# Patient Record
Sex: Male | Born: 1970 | Race: Black or African American | Hispanic: No | Marital: Single | Smoking: Never smoker
Health system: Southern US, Community
[De-identification: ages and names within clinical notes are randomized; demographics above are authoritative.]

## PROBLEM LIST (undated history)

## (undated) DIAGNOSIS — I10 Essential (primary) hypertension: Secondary | ICD-10-CM

## (undated) DIAGNOSIS — I2510 Atherosclerotic heart disease of native coronary artery without angina pectoris: Secondary | ICD-10-CM

---

## 2002-06-28 ENCOUNTER — Inpatient Hospital Stay (HOSPITAL_COMMUNITY): Admission: EM | Admit: 2002-06-28 | Discharge: 2002-06-30 | Payer: Self-pay | Admitting: Emergency Medicine

## 2002-08-02 ENCOUNTER — Encounter: Admission: RE | Admit: 2002-08-02 | Discharge: 2002-08-02 | Payer: Self-pay | Admitting: Internal Medicine

## 2002-09-13 ENCOUNTER — Encounter: Admission: RE | Admit: 2002-09-13 | Discharge: 2002-09-13 | Payer: Self-pay | Admitting: Internal Medicine

## 2002-10-01 ENCOUNTER — Encounter: Admission: RE | Admit: 2002-10-01 | Discharge: 2002-10-01 | Payer: Self-pay | Admitting: Internal Medicine

## 2002-10-18 ENCOUNTER — Encounter: Admission: RE | Admit: 2002-10-18 | Discharge: 2002-10-18 | Payer: Self-pay | Admitting: Internal Medicine

## 2002-10-25 ENCOUNTER — Encounter: Admission: RE | Admit: 2002-10-25 | Discharge: 2002-10-25 | Payer: Self-pay | Admitting: Internal Medicine

## 2002-11-27 ENCOUNTER — Encounter: Admission: RE | Admit: 2002-11-27 | Discharge: 2002-11-27 | Payer: Self-pay | Admitting: Internal Medicine

## 2002-12-11 ENCOUNTER — Encounter: Admission: RE | Admit: 2002-12-11 | Discharge: 2002-12-11 | Payer: Self-pay | Admitting: Internal Medicine

## 2013-10-13 ENCOUNTER — Emergency Department (HOSPITAL_COMMUNITY): Payer: Managed Care, Other (non HMO)

## 2013-10-13 ENCOUNTER — Encounter (HOSPITAL_COMMUNITY): Payer: Self-pay | Admitting: Emergency Medicine

## 2013-10-13 ENCOUNTER — Emergency Department (HOSPITAL_COMMUNITY)
Admission: EM | Admit: 2013-10-13 | Discharge: 2013-10-13 | Disposition: A | Discharge status: Home / Self Care | Payer: Managed Care, Other (non HMO) | Attending: Emergency Medicine | Admitting: Emergency Medicine

## 2013-10-13 DIAGNOSIS — I10 Essential (primary) hypertension: Secondary | ICD-10-CM | POA: Insufficient documentation

## 2013-10-13 DIAGNOSIS — E785 Hyperlipidemia, unspecified: Secondary | ICD-10-CM | POA: Insufficient documentation

## 2013-10-13 DIAGNOSIS — R071 Chest pain on breathing: Secondary | ICD-10-CM | POA: Insufficient documentation

## 2013-10-13 DIAGNOSIS — R0789 Other chest pain: Secondary | ICD-10-CM

## 2013-10-13 DIAGNOSIS — I2510 Atherosclerotic heart disease of native coronary artery without angina pectoris: Secondary | ICD-10-CM | POA: Insufficient documentation

## 2013-10-13 DIAGNOSIS — Z7982 Long term (current) use of aspirin: Secondary | ICD-10-CM | POA: Insufficient documentation

## 2013-10-13 DIAGNOSIS — Z79899 Other long term (current) drug therapy: Secondary | ICD-10-CM | POA: Insufficient documentation

## 2013-10-13 HISTORY — DX: Atherosclerotic heart disease of native coronary artery without angina pectoris: I25.10

## 2013-10-13 HISTORY — DX: Essential (primary) hypertension: I10

## 2013-10-13 LAB — POCT I-STAT TROPONIN I: Troponin i, poc: 0.01 ng/mL (ref 0.00–0.08)

## 2013-10-13 LAB — BASIC METABOLIC PANEL
BUN: 14 mg/dL (ref 6–23)
CHLORIDE: 98 meq/L (ref 96–112)
CO2: 29 mEq/L (ref 19–32)
CREATININE: 1.09 mg/dL (ref 0.50–1.35)
Calcium: 9.5 mg/dL (ref 8.4–10.5)
GFR calc Af Amer: >90 mL/min (ref >90)
GFR calc non Af Amer: 83 mL/min — ABNORMAL LOW (ref >90)
Glucose, Bld: 122 mg/dL — ABNORMAL HIGH (ref 70–99)
Potassium: 3.6 mEq/L — ABNORMAL LOW (ref 3.7–5.3)
SODIUM: 140 meq/L (ref 137–147)

## 2013-10-13 LAB — CBC
HEMATOCRIT: 42.1 % (ref 39.0–52.0)
Hemoglobin: 15 g/dL (ref 13.0–17.0)
MCH: 30.4 pg (ref 26.0–34.0)
MCHC: 35.6 g/dL (ref 30.0–36.0)
MCV: 85.2 fL (ref 78.0–100.0)
Platelets: 222 10*3/uL (ref 150–400)
RBC: 4.94 MIL/uL (ref 4.22–5.81)
RDW: 13.1 % (ref 11.5–15.5)
WBC: 10.6 10*3/uL — AB (ref 4.0–10.5)

## 2013-10-13 LAB — D-DIMER, QUANTITATIVE: D-Dimer, Quant: <0.27 ug/mL-FEU (ref 0.00–0.48)

## 2013-10-13 MED ORDER — ASPIRIN 81 MG PO CHEW
162.0000 mg | CHEWABLE_TABLET | Freq: Once | ORAL | Status: AC
  Administered 2013-10-13: 162 mg via ORAL
  Filled 2013-10-13: qty 2

## 2013-10-13 MED ORDER — KETOROLAC TROMETHAMINE 30 MG/ML IJ SOLN
30.0000 mg | Freq: Once | INTRAMUSCULAR | Status: AC
  Administered 2013-10-13: 30 mg via INTRAVENOUS
  Filled 2013-10-13: qty 1

## 2013-10-13 NOTE — ED Provider Notes (Signed)
CSN: [id number]     Arrival date & time 10/13/13  0418 History   First MD Initiated Contact with Patient 10/13/13 [phone]     Chief Complaint  Patient presents with  . Chest Pain   (Consider location/radiation/quality/duration/timing/severity/associated sxs/prior Treatment) Patient is a 43 y.o. male presenting with chest pain. The history is provided by the patient.  Chest Pain He woke up at 2:30 AM with sharp left-sided chest pain. Pain was mild to moderate and he rated at 4/10. It was worse if he took a deep breath and worse with movement. There is no associated dyspnea, nausea, diaphoresis. He took aspirin 162 mg with no relief. He does have a number having had pain like this before. He does work in [employer] and may have pulled muscle but does not remember a specific action that caused a problem the denies any recent travel and recent surgery. He is a nonsmoker nondrinker. There is a history of hypertension hyperlipidemia but no history of diabetes. He has never had any heart problems. There is no family history of coronary artery disease.  Past Medical History  Diagnosis Date  . Hypertension   . Coronary artery disease    History reviewed. No pertinent past surgical history. No family history on file. History  Substance Use Topics  . Smoking status: Never Smoker   . Smokeless tobacco: Not on file  . Alcohol Use: Yes     Comment: socially    Review of Systems  Cardiovascular: Positive for chest pain.  All other systems reviewed and are negative.    Allergies  Review of patient's allergies indicates not on file.  Home Medications  No current outpatient prescriptions on file. BP 137/85  Pulse 82  Temp(Src) 98.2 F (36.8 C) (Oral)  Resp 18  SpO2 100% Physical Exam  Nursing note and vitals reviewed.  43 year old male, resting comfortably and in no acute distress. Vital signs are normal. Oxygen saturation is 100%, which is normal. Head is normocephalic and atraumatic.  PERRLA, EOMI. Oropharynx is clear. Neck is nontender and supple without adenopathy or JVD. Back is nontender and there is no CVA tenderness. Lungs are clear without rales, wheezes, or rhonchi. Chest is mildly tender in the left anterior chest wall which does reproduce his pain. Heart has regular rate and rhythm without murmur. Abdomen is soft, flat, nontender without masses or hepatosplenomegaly and peristalsis is normoactive. Extremities have no cyanosis or edema, full range of motion is present. Skin is warm and dry without rash. Neurologic: Mental status is normal, cranial nerves are intact, there are no motor or sensory deficits.  ED Course  Procedures (including critical care time) Labs Review Results for orders placed during the hospital encounter of 24/40/10  BASIC METABOLIC PANEL      Result Value Range   Sodium 140  137 - 147 mEq/L   Potassium 3.6 (*) 3.7 - 5.3 mEq/L   Chloride 98  96 - 112 mEq/L   CO2 29  19 - 32 mEq/L   Glucose, Bld 122 (*) 70 - 99 mg/dL   BUN 14  6 - 23 mg/dL   Creatinine, Ser 1.09  0.50 - 1.35 mg/dL   Calcium 9.5  8.4 - 10.5 mg/dL   GFR calc non Af Amer 83 (*) >90 mL/min   GFR calc Af Amer >90  >90 mL/min  CBC      Result Value Range   WBC 10.6 (*) 4.0 - 10.5 K/uL   RBC 4.94  4.22 -  5.81 MIL/uL   Hemoglobin 15.0  13.0 - 17.0 g/dL   HCT 42.1  39.0 - 52.0 %   MCV 85.2  78.0 - 100.0 fL   MCH 30.4  26.0 - 34.0 pg   MCHC 35.6  30.0 - 36.0 g/dL   RDW 13.1  11.5 - 15.5 %   Platelets 222  150 - 400 K/uL  D-DIMER, QUANTITATIVE      Result Value Range   D-Dimer, Quant <0.27  0.00 - 0.48 ug/mL-FEU  POCT I-STAT TROPONIN I      Result Value Range   Troponin i, poc 0.01  0.00 - 0.08 ng/mL   Comment 3            Imaging Review Dg Chest 2 View (if Patient Has Fever And/or Copd)  10/13/2013   CLINICAL DATA:  Chest pain  EXAM: CHEST  2 VIEW  COMPARISON:  None available  FINDINGS: The cardiac and mediastinal silhouettes are within normal limits.  The lungs  are normally inflated. No airspace consolidation, pleural effusion, or pulmonary edema is identified. There is no pneumothorax.  No acute osseous abnormality identified.  IMPRESSION: No active cardiopulmonary disease.   Electronically Signed   By: Jeannine Boga M.D.   On: 10/13/2013 06:11    EKG Interpretation    Date/Time:  Saturday October 13 2013 04:19:32 EST Ventricular Rate:  88 PR Interval:  140 QRS Duration: 92 QT Interval:  352 QTC Calculation: 425 R Axis:   24 Text Interpretation:  Normal sinus rhythm T wave abnormality, consider lateral ischemia Abnormal ECG No old tracing to compare Confirmed by [facility]  MD, Coltan Spinello ([id number]) on 10/13/2013 4:33:47 AM            MDM   1. Musculoskeletal chest pain    Chest pain which seems most likely to be musculoskeletal in. He does have cardiac risk factors of hypertension and hyperlipidemia. He has no risk factors for pulmonary embolism. However, ECG does show T-wave inversions in the lateral leads with no prior ECG available. This could be early changes of left ventricular hypertrophy. Screening labs are obtained and he will be given a trial of ketorolac.  Workup is unremarkable. He had relief of pain with ketorolac. He is discharged with instructions to use over-the-counter naproxen as needed for pain control. He is to followup with his PCP.  Delora Fuel, MD 61/90/12 2241

## 2013-10-13 NOTE — ED Notes (Signed)
Woke up with left sided cp. Feels like cramping. Non reproducible.  Took x 2 baby asa. No other associated symptoms.

## 2013-10-13 NOTE — Discharge Instructions (Signed)
Take two naproxen tablets at a time, twice a day.  Chest Wall Pain Chest wall pain is pain in or around the bones and muscles of your chest. It may take up to 6 weeks to get better. It may take longer if you must stay physically active in your work and activities.  CAUSES  Chest wall pain may happen on its own. However, it may be caused by:  A viral illness like the flu.  Injury.  Coughing.  Exercise.  Arthritis.  Fibromyalgia.  Shingles. HOME CARE INSTRUCTIONS   Avoid overtiring physical activity. Try not to strain or perform activities that cause pain. This includes any activities using your chest or your abdominal and side muscles, especially if heavy weights are used.  Put ice on the sore area.  Put ice in a plastic bag.  Place a towel between your skin and the bag.  Leave the ice on for 15-20 minutes per hour while awake for the first 2 days.  Only take over-the-counter or prescription medicines for pain, discomfort, or fever as directed by your caregiver. SEEK IMMEDIATE MEDICAL CARE IF:   Your pain increases, or you are very uncomfortable.  You have a fever.  Your chest pain becomes worse.  You have new, unexplained symptoms.  You have nausea or vomiting.  You feel sweaty or lightheaded.  You have a cough with phlegm (sputum), or you cough up blood. MAKE SURE YOU:   Understand these instructions.  Will watch your condition.  Will get help right away if you are not doing well or get worse. Document Released: 09/20/2005 Document Revised: 12/13/2011 Document Reviewed: 05/17/2011 [facility] Patient Information 2014 [place], [place].

## 2017-08-25 ENCOUNTER — Emergency Department (HOSPITAL_COMMUNITY)
Admission: EM | Admit: 2017-08-25 | Discharge: 2017-08-25 | Disposition: A | Discharge status: Home / Self Care | Payer: Managed Care, Other (non HMO) | Attending: Emergency Medicine | Admitting: Emergency Medicine

## 2017-08-25 ENCOUNTER — Other Ambulatory Visit: Payer: Self-pay

## 2017-08-25 ENCOUNTER — Encounter (HOSPITAL_COMMUNITY): Payer: Self-pay

## 2017-08-25 DIAGNOSIS — R002 Palpitations: Secondary | ICD-10-CM

## 2017-08-25 DIAGNOSIS — Z79899 Other long term (current) drug therapy: Secondary | ICD-10-CM | POA: Diagnosis not present

## 2017-08-25 DIAGNOSIS — I10 Essential (primary) hypertension: Secondary | ICD-10-CM | POA: Diagnosis not present

## 2017-08-25 DIAGNOSIS — I2510 Atherosclerotic heart disease of native coronary artery without angina pectoris: Secondary | ICD-10-CM | POA: Diagnosis not present

## 2017-08-25 LAB — BASIC METABOLIC PANEL
Anion gap: 8 (ref 5–15)
BUN: 12 mg/dL (ref 6–20)
CHLORIDE: 101 mmol/L (ref 101–111)
CO2: 27 mmol/L (ref 22–32)
Calcium: 9.6 mg/dL (ref 8.9–10.3)
Creatinine, Ser: 1.07 mg/dL (ref 0.61–1.24)
GFR calc non Af Amer: >60 mL/min (ref >60)
Glucose, Bld: 156 mg/dL — ABNORMAL HIGH (ref 65–99)
POTASSIUM: 3.7 mmol/L (ref 3.5–5.1)
SODIUM: 136 mmol/L (ref 135–145)

## 2017-08-25 LAB — CBC
HCT: 42.1 % (ref 39.0–52.0)
Hemoglobin: 14.7 g/dL (ref 13.0–17.0)
MCH: 30.5 pg (ref 26.0–34.0)
MCHC: 34.9 g/dL (ref 30.0–36.0)
MCV: 87.3 fL (ref 78.0–100.0)
Platelets: 230 10*3/uL (ref 150–400)
RBC: 4.82 MIL/uL (ref 4.22–5.81)
RDW: 13.2 % (ref 11.5–15.5)
WBC: 8.8 10*3/uL (ref 4.0–10.5)

## 2017-08-25 NOTE — ED Triage Notes (Signed)
Patient states that when he awoke this am had palpitations. No associated symptoms, NAD. Alert and oriented. Denies CP

## 2017-08-25 NOTE — ED Notes (Signed)
Placed patient on the monitor patient is resting with call bell in reach

## 2017-08-25 NOTE — ED Notes (Signed)
Patient states he was fine before going to bed last pm, this am woke with a fluttering in his chest . Denies chest pain  Or sob. States he feels skipped beats ,

## 2017-08-25 NOTE — ED Provider Notes (Signed)
[facility] EMERGENCY DEPARTMENT Provider Note   CSN: [id number] Arrival date & time: 08/25/17  4098     History   Chief Complaint Chief Complaint  Patient presents with  . Palpitations    HPI Donald Fletcher is a 46 y.o. male.  HPI  Donald Fletcher is a 46yo male with a history of hypertension, hyperlipidemia who presents to the Emergency Department for evaluation of fluttering in the chest.  Patient states that he has had brief intermittent episodes of fluttering in the chest for years.  In the past these episodes have lasted seconds at a time. States that this morning the fluttering woke him up from sleep and persisted for 2 hours, which is atypical.  He denies associated chest pain, shortness of breath, diaphoresis, syncope, lightheadedness or dizziness.  States that the onset and offset of the fluttering sensation is gradual.  Is wondering if it has anything to do with anxiety as he has had more stress at work recently working longer hours and having more responsibility.  States that he was anxious on his way to the hospital and had another episode of fluttering in the chest.  Denies this sensation currently. Also states that he has muscle twitching in the temples when he has episodes of fluttering.  Denies history of exertional chest pain.  Denies smoking history.  Denies recreational drug use.  Denies family history of sudden cardiac death or ischemic heart disease.  Past Medical History:  Diagnosis Date  . Coronary artery disease   . Hypertension     There are no active problems to display for this patient.   History reviewed. No pertinent surgical history.     Home Medications    Prior to Admission medications   Medication Sig Start Date End Date Taking? Authorizing Provider  amLODipine (NORVASC) 5 MG tablet Take 1 tablet by mouth daily. 09/12/13  Yes [provider]  aspirin EC 81 MG tablet Take 81 mg by mouth daily as needed for mild pain.     Yes [provider]  brimonidine (ALPHAGAN) 0.2 % ophthalmic solution Place 1 drop into both eyes 2 (two) times daily. 10/11/13  Yes [provider]  lisinopril-hydrochlorothiazide (PRINZIDE,ZESTORETIC) 20-12.5 MG per tablet Take 1 tablet by mouth daily. 10/05/13  Yes [provider]  simvastatin (ZOCOR) 20 MG tablet Take 1 tablet by mouth daily. 10/01/13  Yes [provider]    Family History No family history on file.  Social History Social History   Tobacco Use  . Smoking status: Never Smoker  . Smokeless tobacco: Never Used  Substance Use Topics  . Alcohol use: Yes    Comment: socially  . Drug use: No     Allergies   Patient has no known allergies.   Review of Systems Review of Systems  Constitutional: Negative for diaphoresis and fever.  Eyes: Negative for visual disturbance.  Respiratory: Negative for cough and shortness of breath.   Cardiovascular: Positive for palpitations. Negative for chest pain and leg swelling.  Gastrointestinal: Negative for abdominal pain, nausea and vomiting.  Genitourinary: Negative for difficulty urinating.  Musculoskeletal: Negative for back pain.  Skin: Negative for rash.  Neurological: Negative for dizziness, weakness, light-headedness, numbness and headaches.  Psychiatric/Behavioral: The patient is nervous/anxious.      Physical Exam Updated Vital Signs BP 131/76   Pulse 62   Temp 98.4 F (36.9 C) (Oral)   Resp (!) 21   Ht 5' 9"  (1.753 m)   Wt 99.8  kg (220 lb)   SpO2 98%   BMI 32.49 kg/m   Physical Exam  Constitutional: He is oriented to person, place, and time. He appears well-developed and well-nourished. No distress.  HENT:  Head: Normocephalic and atraumatic.  Mouth/Throat: Oropharynx is clear and moist. No oropharyngeal exudate.  Eyes: Conjunctivae are normal. Pupils are equal, round, and reactive to light. Right eye exhibits no discharge. Left eye exhibits no discharge.  Neck:  Normal range of motion. Neck supple.  No thyromegaly or thyroid nodules appreciated.   Cardiovascular:  One premature beat heard on auscultation. Patient identifies that he feels this heart beat. Otherwise normal rate, regular rhythm. No murmurs. Intact distal pulses.   Pulmonary/Chest: Effort normal and breath sounds normal. No stridor. No respiratory distress. He has no wheezes. He has no rales. He exhibits no tenderness.  Abdominal: Soft. Bowel sounds are normal. There is no tenderness. There is no guarding.  Musculoskeletal: Normal range of motion.  Lymphadenopathy:    He has no cervical adenopathy.  Neurological: He is alert and oriented to person, place, and time. Coordination normal.  Skin: Skin is warm and dry. Capillary refill takes less than 2 seconds. He is not diaphoretic.  Psychiatric: He has a normal mood and affect. His behavior is normal.  Nursing note and vitals reviewed.    ED Treatments / Results  Labs (all labs ordered are listed, but only abnormal results are displayed) Labs Reviewed  BASIC METABOLIC PANEL - Abnormal; Notable for the following components:      Result Value   Glucose, Bld 156 (*)    All other components within normal limits  CBC    EKG  EKG Interpretation  Date/Time:  Thursday August 25 2017 09:33:16 EST Ventricular Rate:  85 PR Interval:  156 QRS Duration: 84 QT Interval:  380 QTC Calculation: 452 R Axis:   39 Text Interpretation:  Normal sinus rhythm Nonspecific T wave abnormality No significant change since last tracing Confirmed by Lajean Saver [phone]) on 08/25/2017 9:57:06 AM       Radiology No results found.  Procedures Procedures (including critical care time)  Medications Ordered in ED Medications - No data to display   Initial Impression / Assessment and Plan / ED Course  I have reviewed the triage vital signs and the nursing notes.  Pertinent labs & imaging results that were available during my care of the  patient were reviewed by me and considered in my medical decision making (see chart for details).     Patient presents with intermittent fluttering in the chest. No associated CP, SOB, diaphoresis, N/V, syncope. No prior history of cardiac ischemia. Vital signs stable. Do not suspect ACS given presentation. EKG normal sinus with t-wave inversions in inferior leads, this is stable from his previous EKG 10/2013. CBC and CMP unremarkable.   Discussed patient's lab work and EKG at bedside. Suspect his palpitations may be him feeling an occasional PVC given premature beat heard on auscultation with patient recognizing this as abnormal. Counseled him to have follow up with his PCP regarding his ER visit today. His blood pressure was mildly elevated, patient states he has not taking his home bp medication. Counseled him to have blood pressure recheck with PCP. His glucose was also elevated, patient reports eating a biscuit prior to getting blood draw. Counseled him to have this rechecked as well. Discussed return precautions and patient agrees and voices understanding to this plan. Discussed this patient with Dr. Ashok Cordia who agrees with plan to  discharge.    Final Clinical Impressions(s) / ED Diagnoses   Final diagnoses:  Palpitations    ED Discharge Orders    None       Bernarda Caffey 08/25/17 1110    Lajean Saver, MD 08/25/17 1236

## 2017-08-25 NOTE — Discharge Instructions (Signed)
Your EKG and blood work were very reassuring today.  Please schedule an appointment with your primary doctor to discuss your ER visit today and also for recheck of your blood pressure which was elevated today.  Return to the emergency department if you have fluttering in the chest with associated chest pain, shortness of breath, nausea/vomiting or feeling like you are going to pass out.

## 2018-09-18 ENCOUNTER — Encounter (HOSPITAL_COMMUNITY): Payer: Self-pay | Admitting: Emergency Medicine

## 2018-09-18 ENCOUNTER — Ambulatory Visit (HOSPITAL_COMMUNITY)
Admission: EM | Admit: 2018-09-18 | Discharge: 2018-09-18 | Disposition: A | Discharge status: Home / Self Care | Payer: Managed Care, Other (non HMO) | Attending: Physician Assistant | Admitting: Physician Assistant

## 2018-09-18 DIAGNOSIS — R519 Headache, unspecified: Secondary | ICD-10-CM

## 2018-09-18 DIAGNOSIS — R51 Headache: Secondary | ICD-10-CM | POA: Diagnosis not present

## 2018-09-18 NOTE — Discharge Instructions (Signed)
No alarming signs on exam. Naproxen 474m twice a day. You can take tylenol for break through pain. Keep hydrated, your urine should be clear to pale yellow in color. Follow up with PCP for further evaluation if symptoms not improving. If experiencing worsening of symptoms, worsening headache/worse headache of your life, blurry vision, nausea/vomiting, confusion/altered mental status, weakness, passing out, imbalance, go to the emergency department for further evaluation.

## 2018-09-18 NOTE — ED Provider Notes (Signed)
[name]    CSN: [id number] Arrival date & time: 09/18/18  1104     History   Chief Complaint Chief Complaint  Patient presents with  . Headache    HPI Donald Fletcher is a 47 y.o. male.   47 year old male comes in for 2-week history of headache.  States headache is left-sided with sharp pain that resolves immediately, but returns every 15 to 30 minutes.  Pain is about a 3 out of 10, has been taking Tylenol/Motrin with improvement to about 1 out of 10.  He has had some dizziness/lightheadedness with positional changes and/or head movement.  Denies one-sided weakness, syncope.  Denies chest pain, shortness of breath, palpitation.  States had history of anemia, so has been taking some iron pills to see if it helps with symptoms.  Denies URI symptoms such as cough, congestion, sore throat.  Denies fever, chills, night sweats.  Occasional alcohol use, stopped using alcohol 2 weeks ago which helped dizziness slightly.  Never smoker.  Denies illicit drug use.  Patient states has been seen by [facility] urgent [facility], and basic labs were drawn.  At the time, they suggested switching to Aleve to help with symptoms, which did help improve symptoms, but did not resolve symptoms.  Also was noted to have high blood pressure, so has been eating more healthy and exercising and blood pressure is under control.     Past Medical History:  Diagnosis Date  . Coronary artery disease   . Hypertension     There are no active problems to display for this patient.   History reviewed. No pertinent surgical history.     Home Medications    Prior to Admission medications   Medication Sig Start Date End Date Taking? Authorizing Provider  amLODipine (NORVASC) 5 MG tablet Take 1 tablet by mouth daily. 09/12/13   [provider]  aspirin EC 81 MG tablet Take 81 mg by mouth daily as needed for mild pain.     [provider]  brimonidine (ALPHAGAN) 0.2 % ophthalmic solution  Place 1 drop into both eyes 2 (two) times daily. 10/11/13   [provider]  lisinopril-hydrochlorothiazide (PRINZIDE,ZESTORETIC) 20-12.5 MG per tablet Take 1 tablet by mouth daily. 10/05/13   [provider]  simvastatin (ZOCOR) 20 MG tablet Take 1 tablet by mouth daily. 10/01/13   [provider]    Family History No family history on file.  Social History Social History   Tobacco Use  . Smoking status: Never Smoker  . Smokeless tobacco: Never Used  Substance Use Topics  . Alcohol use: Yes    Comment: socially  . Drug use: No     Allergies   Patient has no known allergies.   Review of Systems Review of Systems   Physical Exam Triage Vital Signs ED Triage Vitals  Enc Vitals Group     BP 09/18/18 1155 125/80     Pulse Rate 09/18/18 1155 74     Resp 09/18/18 1155 16     Temp 09/18/18 1155 98.3 F (36.8 C)     Temp src --      SpO2 09/18/18 1155 99 %     Weight --      Height --      Head Circumference --      Peak Flow --      Pain Score 09/18/18 1156 0     Pain Loc --      Pain Edu? --  Excl. in GC? --    Orthostatic VS for the past 24 hrs:  BP- Lying Pulse- Lying BP- Sitting Pulse- Sitting  09/18/18 1316 131/79 72 125/80 75    Updated Vital Signs BP 125/80   Pulse 74   Temp 98.3 F (36.8 C)   Resp 16   SpO2 99%   Physical Exam Constitutional:      General: He is not in acute distress.    Appearance: He is well-developed. He is not ill-appearing, toxic-appearing or diaphoretic.  HENT:     Head: Normocephalic and atraumatic.     Right Ear: Tympanic membrane, ear canal and external ear normal. Tympanic membrane is not erythematous or bulging.     Left Ear: Tympanic membrane, ear canal and external ear normal. Tympanic membrane is not erythematous or bulging.     Nose: Nose normal.     Right Sinus: No maxillary sinus tenderness or frontal sinus tenderness.     Left Sinus: No maxillary sinus tenderness or frontal sinus  tenderness.     Mouth/Throat:     Pharynx: Uvula midline.  Eyes:     Extraocular Movements: Extraocular movements intact.     Conjunctiva/sclera: Conjunctivae normal.     Pupils: Pupils are equal, round, and reactive to light.  Neck:     Musculoskeletal: Normal range of motion and neck supple.  Cardiovascular:     Rate and Rhythm: Normal rate and regular rhythm.     Heart sounds: Normal heart sounds. No murmur. No friction rub. No gallop.   Pulmonary:     Effort: Pulmonary effort is normal. No respiratory distress.     Breath sounds: Normal breath sounds. No stridor. No decreased breath sounds, wheezing, rhonchi or rales.  Lymphadenopathy:     Cervical: No cervical adenopathy.  Skin:    General: Skin is warm and dry.  Neurological:     Mental Status: He is alert and oriented to person, place, and time.     GCS: GCS eye subscore is 4. GCS verbal subscore is 5. GCS motor subscore is 6.     Cranial Nerves: Cranial nerves are intact.     Sensory: Sensation is intact.     Motor: Motor function is intact. No pronator drift.     Coordination: Coordination is intact. Romberg sign negative. Coordination normal. Finger-Nose-Finger Test and Heel to [facility] Test normal.     Gait: Gait is intact. Gait and tandem walk normal.  Psychiatric:        Behavior: Behavior normal.        Judgment: Judgment normal.     UC Treatments / Results  Labs (all labs ordered are listed, but only abnormal results are displayed) Labs Reviewed - No data to display  EKG None  Radiology No results found.  Procedures Procedures (including critical care time)  Medications Ordered in UC Medications - No data to display  Initial Impression / Assessment and Plan / UC Course  I have reviewed the triage vital signs and the nursing notes.  Pertinent labs & imaging results that were available during my care of the patient were reviewed by me and considered in my medical decision making (see chart for  details).    Negative orthostatics. On reexam, patient states recalled that he hit head with equipment at work about 2 weeks ago. Denies LOC at that time. Neurology exam grossly intact. Will have patient continue symptomatic treatment and follow up with PCP for further evaluation if symptoms not improving. Return precautions  given. Patient expresses understanding and agrees to plan.  Final Clinical Impressions(s) / UC Diagnoses   Final diagnoses:  Acute intractable headache, unspecified headache type    ED Prescriptions    None        Ok Edwards, PA-C 09/18/18 1336

## 2018-09-18 NOTE — ED Triage Notes (Signed)
Pt states hes had a slight headache x2 weeks. Went to his PCp and his BP was elevated, has seen several people and they just stated it was his elevated BP. Pt states his BP has been normal and his head still hurts.

## 2018-09-25 ENCOUNTER — Emergency Department (HOSPITAL_COMMUNITY): Payer: Managed Care, Other (non HMO)

## 2018-09-25 ENCOUNTER — Encounter (HOSPITAL_COMMUNITY): Payer: Self-pay

## 2018-09-25 ENCOUNTER — Emergency Department (HOSPITAL_COMMUNITY)
Admission: EM | Admit: 2018-09-25 | Discharge: 2018-09-25 | Disposition: A | Discharge status: Home / Self Care | Payer: Managed Care, Other (non HMO) | Attending: Emergency Medicine | Admitting: Emergency Medicine

## 2018-09-25 ENCOUNTER — Other Ambulatory Visit: Payer: Self-pay

## 2018-09-25 DIAGNOSIS — W208XXD Other cause of strike by thrown, projected or falling object, subsequent encounter: Secondary | ICD-10-CM | POA: Diagnosis not present

## 2018-09-25 DIAGNOSIS — Z79899 Other long term (current) drug therapy: Secondary | ICD-10-CM | POA: Insufficient documentation

## 2018-09-25 DIAGNOSIS — F0781 Postconcussional syndrome: Secondary | ICD-10-CM | POA: Diagnosis not present

## 2018-09-25 DIAGNOSIS — R51 Headache: Secondary | ICD-10-CM | POA: Insufficient documentation

## 2018-09-25 NOTE — ED Triage Notes (Signed)
Pt states he has had headache X3 weeks. Pt states he was hit in the head with a metal hose at work 3 weeks ago that he initially forgot. He has been seen at [facility]. Pt alert and oriented, denies any dizziness.

## 2018-09-25 NOTE — ED Notes (Signed)
Pt. Stated Headache for 3 weeks. I got hit with a metal piece at work, but Donnald Garre had a headache since then. Its been off and on at a pain scale  1/10. I m going out of town next week, and I was hoping I could have a xray or something.
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
## 2018-11-05 ENCOUNTER — Emergency Department (HOSPITAL_COMMUNITY)
Admission: EM | Admit: 2018-11-05 | Discharge: 2018-11-05 | Disposition: A | Discharge status: Home / Self Care | Payer: Managed Care, Other (non HMO) | Attending: Emergency Medicine | Admitting: Emergency Medicine

## 2018-11-05 ENCOUNTER — Encounter (HOSPITAL_COMMUNITY): Payer: Self-pay | Admitting: Emergency Medicine

## 2018-11-05 DIAGNOSIS — I10 Essential (primary) hypertension: Secondary | ICD-10-CM

## 2018-11-05 DIAGNOSIS — I2510 Atherosclerotic heart disease of native coronary artery without angina pectoris: Secondary | ICD-10-CM | POA: Diagnosis not present

## 2018-11-05 DIAGNOSIS — Z79899 Other long term (current) drug therapy: Secondary | ICD-10-CM | POA: Insufficient documentation

## 2018-11-05 NOTE — ED Provider Notes (Signed)
[facility] Provider Note   CSN: [id number] Arrival date & time: 11/05/18  1837     History   Chief Complaint Chief Complaint  Patient presents with  . Hypertension  . Headache    HPI Donald Fletcher is a 48 y.o. male.  48yo M w/ h/o CAD, HTN who p/w HTN.  Patient has history of hypertension and is on medication for it.  Recently his PCP increased his amlodipine and he is supposed to follow-up soon for recheck.  He notes that he went out of town on vacation and forgot to bring his medications with him.  He missed yesterday's dose of medicines.  Today as he was driving home he noticed some mild pressure on his left forehead.  He became concerned that his blood pressure may be elevated and he checked it and noted hypertension.  He checked it 2 more times and it continued to be high.  He became concerned when it was 371 systolic.  He took his home medications approximately an hour ago.  Right now his head pressure has resolved and he denies any complaints including no vision changes, numbness/weakness, chest pain, breathing problems, nausea, or vomiting.  He reports that he ate poorly while he was on vacation.  The history is provided by the patient.  Hypertension  Associated symptoms include headaches.  Headache    Past Medical History:  Diagnosis Date  . Coronary artery disease   . Hypertension     There are no active problems to display for this patient.   History reviewed. No pertinent surgical history.      Home Medications    Prior to Admission medications   Medication Sig Start Date End Date Taking? Authorizing Provider  amLODipine (NORVASC) 5 MG tablet Take 1 tablet by mouth daily. 09/12/13   [provider]  aspirin EC 81 MG tablet Take 81 mg by mouth daily as needed for mild pain.     [provider]  brimonidine (ALPHAGAN) 0.2 % ophthalmic solution Place 1 drop into both eyes 2 (two) times daily. 10/11/13    [provider]  lisinopril-hydrochlorothiazide (PRINZIDE,ZESTORETIC) 20-12.5 MG per tablet Take 1 tablet by mouth daily. 10/05/13   [provider]  simvastatin (ZOCOR) 20 MG tablet Take 1 tablet by mouth daily. 10/01/13   [provider]    Family History No family history on file.  Social History Social History   Tobacco Use  . Smoking status: Never Smoker  . Smokeless tobacco: Never Used  Substance Use Topics  . Alcohol use: Yes    Comment: socially  . Drug use: No     Allergies   Patient has no known allergies.   Review of Systems Review of Systems  Neurological: Positive for headaches.   All other systems reviewed and are negative except that which was mentioned in HPI   Physical Exam Updated Vital Signs BP (!) 151/103   Pulse 95   Temp 98.3 F (36.8 C) (Oral)   Resp 18   Ht 5' 9"  (1.753 m)   Wt 99.8 kg   SpO2 98%   BMI 32.49 kg/m   Physical Exam Vitals signs and nursing note reviewed.  Constitutional:      General: He is not in acute distress.    Appearance: He is well-developed.  HENT:     Head: Normocephalic and atraumatic.  Eyes:     Extraocular Movements: Extraocular movements intact.     Conjunctiva/sclera: Conjunctivae normal.  Pupils: Pupils are equal, round, and reactive to light.  Neck:     Musculoskeletal: Neck supple.  Cardiovascular:     Rate and Rhythm: Normal rate and regular rhythm.     Heart sounds: Normal heart sounds. No murmur.  Pulmonary:     Effort: Pulmonary effort is normal.     Breath sounds: Normal breath sounds.  Abdominal:     General: Bowel sounds are normal. There is no distension.     Palpations: Abdomen is soft.     Tenderness: There is no abdominal tenderness.  Skin:    General: Skin is warm and dry.  Neurological:     Mental Status: He is alert and oriented to person, place, and time.     Cranial Nerves: No cranial nerve deficit or dysarthria.     Sensory: No sensory deficit.       Motor: No weakness.     Coordination: Coordination normal.     Deep Tendon Reflexes: Reflexes normal.     Comments: Fluent speech, no clonus, 5/5 strength throughout  Psychiatric:        Mood and Affect: Mood normal.        Speech: Speech normal.        Behavior: Behavior normal.        Judgment: Judgment normal.      ED Treatments / Results  Labs (all labs ordered are listed, but only abnormal results are displayed) Labs Reviewed - No data to display  EKG None  Radiology No results found.  Procedures Procedures (including critical care time)  Medications Ordered in ED Medications - No data to display   Initial Impression / Assessment and Plan / ED Course  I have reviewed the triage vital signs and the nursing notes.      BP improved since arriving to the ED. No concerning features to suggest hypertensive emergency.  He understands importance of continuing his home medications and returning to his low-salt diet.  He already plans to follow-up with PCP for blood pressure recheck.  Return precautions reviewed and he voiced understanding.  Final Clinical Impressions(s) / ED Diagnoses   Final diagnoses:  Essential hypertension    ED Discharge Orders    None       Tolbert Matheson, Wenda Overland, MD 11/05/18 2007

## 2018-11-05 NOTE — ED Triage Notes (Signed)
Pt states he went to the beach without his meds X 1 day. Came home today and began having headache to forehead. No blurred vision or neuro deficits. Pt took his BP and noted it was in the 200s. Pt then took a dose of his missed BP meds, that was around 1820.
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
## 2021-01-29 LAB — EXTERNAL GENERIC LAB PROCEDURE

## 2021-03-17 LAB — EXTERNAL GENERIC LAB PROCEDURE: COLOGUARD: NEGATIVE

## 2023-01-04 ENCOUNTER — Other Ambulatory Visit: Payer: Self-pay

## 2023-01-04 ENCOUNTER — Ambulatory Visit
Admission: EM | Admit: 2023-01-04 | Discharge: 2023-01-04 | Disposition: A | Discharge status: Home / Self Care | Payer: Managed Care, Other (non HMO) | Attending: Physician Assistant | Admitting: Physician Assistant

## 2023-01-04 ENCOUNTER — Encounter: Payer: Self-pay | Admitting: Emergency Medicine

## 2023-01-04 DIAGNOSIS — L234 Allergic contact dermatitis due to dyes: Secondary | ICD-10-CM | POA: Diagnosis not present

## 2023-01-04 MED ORDER — PREDNISONE 10 MG PO TABS: 10.0000 mg | ORAL_TABLET | Freq: Three times a day (TID) | ORAL | 0 refills | Status: AC

## 2023-01-04 MED ORDER — TRIAMCINOLONE ACETONIDE 40 MG/ML IJ SUSP
40.0000 mg | Freq: Once | INTRAMUSCULAR | Status: AC
  Administered 2023-01-04: 40 mg via INTRAMUSCULAR

## 2023-01-04 NOTE — Discharge Instructions (Signed)
Advised to take the Benadryl 25 mg every 6-8 hours on a regular basis to help decrease itching and irritability. Advise to take Claritin 10 mg once daily overThe next 10-day days to help with decrease swelling and itching. Advised take prednisone 10 mg 3 times a day for 5 days only to help reduce the inflammatory component  Advised follow-up with PCP return to urgent care as needed.

## 2023-01-04 NOTE — ED Provider Notes (Signed)
[name]    CSN: [id number] Arrival date & time: 01/04/23  1404      History   Chief Complaint Chief Complaint  Patient presents with   Allergic Reaction    HPI Donald Fletcher is a 52 y.o. male.   52 year old male who presents with scalp itching and swelling.  Patient indicates that he has used a hair dye product in the past and he had some mild itching and irritation.  He relates that this resolved fairly quickly.  He relates that he decided to die is here again 2 days ago.  He indicates using a different diet, and then immediately afterwards his scalp started itching.  Patient indicates over the next 24 hours the scalp started burning, feeling like it was swelling and getting a tight feeling.  Patient indicates that this morning he woke up still having the scalp itch, tightness, irritated type feeling.  Patient indicates this morning he started taking Benadryl 25 mg to help improve the symptoms.  He indicates he is without fever, and there is no wheezing, shortness of breath, or difficulty breathing.   Allergic Reaction Presenting symptoms: rash (scalp)     Past Medical History:  Diagnosis Date   Coronary artery disease    Hypertension     There are no problems to display for this patient.   History reviewed. No pertinent surgical history.     Home Medications    Prior to Admission medications   Medication Sig Start Date End Date Taking? Authorizing Provider  predniSONE (DELTASONE) 10 MG tablet Take 1 tablet (10 mg total) by mouth in the morning, at noon, and at bedtime. 01/04/23  Yes Nyoka Lint, PA-C  amLODipine (NORVASC) 5 MG tablet Take 1 tablet by mouth daily. 09/12/13   [provider]  aspirin EC 81 MG tablet Take 81 mg by mouth daily as needed for mild pain.     [provider]  brimonidine (ALPHAGAN) 0.2 % ophthalmic solution Place 1 drop into both eyes 2 (two) times daily. 10/11/13   [provider]   lisinopril-hydrochlorothiazide (PRINZIDE,ZESTORETIC) 20-12.5 MG per tablet Take 1 tablet by mouth daily. 10/05/13   [provider]  simvastatin (ZOCOR) 20 MG tablet Take 1 tablet by mouth daily. 10/01/13   [provider]    Family History History reviewed. No pertinent family history.  Social History Social History   Tobacco Use   Smoking status: Never   Smokeless tobacco: Never  Substance Use Topics   Alcohol use: Yes    Comment: socially   Drug use: No     Allergies   Patient has no known allergies.   Review of Systems Review of Systems  Skin:  Positive for rash (scalp).     Physical Exam Triage Vital Signs ED Triage Vitals  Enc Vitals Group     BP 01/04/23 1440 (!) 151/102     Pulse Rate 01/04/23 1440 95     Resp 01/04/23 1440 18     Temp 01/04/23 1440 98.3 F (36.8 C)     Temp Source 01/04/23 1440 Oral     SpO2 01/04/23 1440 95 %     Weight --      Height --      Head Circumference --      Peak Flow --      Pain Score 01/04/23 1441 0     Pain Loc --      Pain Edu? --      Excl.  in [place]? --    No data found.  Updated Vital Signs BP (!) 151/102 (BP Location: Left Arm)   Pulse 95   Temp 98.3 F (36.8 C) (Oral)   Resp 18   SpO2 95%   Visual Acuity Right Eye Distance:   Left Eye Distance:   Bilateral Distance:    Right Eye Near:   Left Eye Near:    Bilateral Near:     Physical Exam Constitutional:      Appearance: Normal appearance.  HENT:     Right Ear: Tympanic membrane and ear canal normal.     Left Ear: Tympanic membrane and ear canal normal.     Mouth/Throat:     Mouth: Mucous membranes are moist.     Pharynx: Oropharynx is clear.  Skin:         Comments: Scalp: There is diffuse redness along the hair and scalp line with mild pustular papular appearance, mild redness.  This extends beyond the scalp to the sides in the front of the scalp with mild redness there is minimal swelling, no drainage at the site.   Neurological:     Mental Status: He is alert.      UC Treatments / Results  Labs (all labs ordered are listed, but only abnormal results are displayed) Labs Reviewed - No data to display  EKG   Radiology No results found.  Procedures Procedures (including critical care time)  Medications Ordered in UC Medications  triamcinolone acetonide (KENALOG-40) injection 40 mg (40 mg Intramuscular Given 01/04/23 1459)    Initial Impression / Assessment and Plan / UC Course  I have reviewed the triage vital signs and the nursing notes.  Pertinent labs & imaging results that were available during my care of the patient were reviewed by me and considered in my medical decision making (see chart for details).    Plan: The diagnosis will be treated with the following: 1.  Allergic contact dermatitis: A.  Kenalog 40 mg IM given to help reduce the allergic reaction. B.  Prednisone 10 mg 3 times a day for 5 days only to help reduce the allergic component. C.  Advised to continue taking Benadryl 25 mg every 6-8 hours to help reduce itching and reaction. 2.  Advised follow-up PCP return to urgent care as needed. Final Clinical Impressions(s) / UC Diagnoses   Final diagnoses:  Allergic contact dermatitis due to dyes     Discharge Instructions      Advised to take the Benadryl 25 mg every 6-8 hours on a regular basis to help decrease itching and irritability. Advise to take Claritin 10 mg once daily overThe next 10-day days to help with decrease swelling and itching. Advised take prednisone 10 mg 3 times a day for 5 days only to help reduce the inflammatory component  Advised follow-up with PCP return to urgent care as needed.    ED Prescriptions     Medication Sig Dispense Auth. Provider   predniSONE (DELTASONE) 10 MG tablet Take 1 tablet (10 mg total) by mouth in the morning, at noon, and at bedtime. 15 tablet Nyoka Lint, PA-C      PDMP not reviewed this encounter.    Nyoka Lint, PA-C 01/04/23 1505

## 2023-01-04 NOTE — ED Triage Notes (Signed)
Pt here for allergic reaction to hair dye; pt sts used 2 days ago and had itching and feels like his scalp is now swollen

## 2023-10-09 NOTE — Progress Notes (Signed)
 NEW PATIENT Date of Service/Encounter:  10/10/23 Referring provider: Medicine, [facility]* Primary care provider: Medicine, [facility]  Subjective:  Donald Fletcher is a 53 y.o. male with a PMHx of hypertension, hypercholesterolemia, diabetes mellitus presenting today for evaluation of lip swelling and discoloration.  History obtained from: chart review and patient.   Discussed the use of AI scribe software for clinical note transcription with the patient, who gave verbal consent to proceed.  History of Present Illness   The patient, with a history of seasonal allergies, presents with a recent allergic reaction to almonds. He has always had seasonal allergies, particularly in the spring/summer months of March, April, and May. Symptoms include itching, sneezing, and eye irritation. Over the years, he has tried various allergy  medications, including Zyrtec and Claritin, but found the most relief from [facility] and wearing a mask during allergy  season.  In addition to seasonal allergies, the patient has a known allergy  to shellfish, including shrimp and crabs, which cause his throat and lips to itch and swell. Recently, he experienced a similar reaction after consuming almonds, a food he previously tolerated. The reaction included itching and swelling of the lips, followed by the appearance of small bumps on the upper lip. The swelling and itching resolved after one day - affected bottom lip only. Now he reports upper lip bumps that appeared and are not bothersome, but he is unsure what they are from. He denies lip swelling, itching, burning or pain.  The bumps have not changed over time and have been present for weeks. He does notice some tingling with his tooth paste which is mint flavored but he has used for years.    The patient also reports occasional skin irritation, particularly on the hands, which he manages with a prescribed steroid cream (clotrimazole + beclomethasone). 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  Lastly, the patient has experienced scalp irritation after using a specific brand of hair dye. The irritation presents as itching and was severe enough to cause a visit to the emergency room last year in April 2024.  The patient has not had any allergy  testing in the past and is currently avoiding all nuts following the recent reaction to almonds. He is also considering potential reactions to his toothpaste, which causes a tingling sensation when used.  He denies asthma and eczema. He does have an epipen prescription from his primary provider.       Chart Review:  Reviewed PCP notes from referral 08/04/23: discoloration of upper lip for one month. The discoloration was noticed after he had an allergic reaction to almonds one month ago and his mouth swelled. He does not know if the small spots were there prior to or if he just noticed after the lip swelling with the allergic reaction. The area is not painful and he can't feel anything different in the lip    Past Medical History: Past Medical History:  Diagnosis Date   Coronary artery disease    Hypertension    Medication List:  Current Outpatient Medications  Medication Sig Dispense Refill   amLODipine (NORVASC) 5 MG tablet Take 1 tablet by mouth daily.     aspirin  EC 81 MG tablet Take 81 mg by mouth daily as needed for mild pain.      brimonidine (ALPHAGAN) 0.2 % ophthalmic solution Place 1 drop into both eyes 2 (two) times daily.     carvedilol (COREG)  25 MG tablet Take 1 tablet by mouth 2 (two) times daily.     clotrimazole-betamethasone (LOTRISONE) cream Apply 1 Application topically 2 (two) times daily.     fluticasone (FLONASE) 50 MCG/ACT nasal spray Place 2 sprays into both nostrils daily.     latanoprost (XALATAN) 0.005 % ophthalmic solution Place 1 drop into both eyes at bedtime.     lisinopril-hydrochlorothiazide  (PRINZIDE,ZESTORETIC) 20-12.5 MG per tablet Take 1 tablet by mouth daily.     simvastatin (ZOCOR) 20 MG tablet Take 1 tablet by mouth daily.     predniSONE  (DELTASONE ) 10 MG tablet Take 1 tablet (10 mg total) by mouth in the morning, at noon, and at bedtime. (Patient not taking: Reported on 10/10/2023) 15 tablet 0   No current facility-administered medications for this visit.   Known Allergies:  No Known Allergies Past Surgical History: History reviewed. No pertinent surgical history. Family History: History reviewed. No pertinent family history. Social History: Donald Fletcher lives in a house built 60 years ago, no water damage, wood floors with carpet, electric heating, window cooling, outdoor cats, no roaches, using DM covers on bed and pillows, works as an fish farm manager for [employer], exposed to dust and fumes at work, no HEPA filter in home, [place]. Non-smoker..   ROS:  All other systems negative except as noted per HPI.  Objective:  Blood pressure (!) 142/88, pulse 72, temperature 98.9 F (37.2 C), height 5' 8 (1.727 m), weight 217 lb 12.8 oz (98.8 kg), SpO2 97%. Body mass index is 33.12 kg/m. Physical Exam:  General Appearance:  Alert, cooperative, no distress, appears stated age  Head:  Normocephalic, without obvious abnormality, atraumatic  Eyes:  Conjunctiva clear, EOM's intact  Ears EACs normal bilaterally and normal TMs bilaterally  Nose: Nares normal, hypertrophic turbinates, normal mucosa, no visible anterior polyps, and septum midline  Throat: Lips with small skin colored/whitish pinpoint papules on upper lip, tongue normal; teeth and gums normal, normal posterior oropharynx  Neck: Supple, symmetrical  Lungs:   clear to auscultation bilaterally, Respirations unlabored, no coughing  Heart:  regular rate and rhythm and no murmur, Appears well perfused  Extremities: No edema  Skin: Dry skin on hands, no overt rashes  Neurologic: No gross  deficits   Diagnostics:  Labs:  Lab Orders  No laboratory test(s) ordered today     Assessment and Plan  Assessment and Plan    Suspected Seasonal Allergies Symptoms of itching, sneezing, and eye irritation during spring/summer months (March-June). Current management with Flonase has been effective with daily antihistamine (OTC). -Continue Flonase 2 sprays each nostril daily as needed during allergy  season. -continue antihistamine daily as needed: Your options include: Zyrtec (cetirizine) 10 mg, Claritin (loratadine) 10 mg, Xyzal (levocetirizine) 5 mg or Allegra (fexofenadine) 180 mg daily as needed -Schedule skin prick testing for pollen allergies on 10/17/2023. Must stop antihistamines for 3 days prior to this visit.  Food Allergies (shellfish, tree nuts, peanut ) Recent reaction to almonds with lip swelling and itching, similar to previous reactions to shellfish. -Avoid all nuts and shellfish until further testing. -Schedule skin prick testing for nut and shellfish allergies on 10/17/2023. -continue to carry epipen in case of accidental exposure and reaction -consider medical alert jewelry -emergency action plan provided and reviewed.   Contact Dermatitis  History of scalp irritation with hair dye use and recent lip irritation with unknown cause. 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 -Continue avoidance of hair dye and Carmax. -Use Vaseline  or Aquaphor for lip care. -Perform home test with toothpaste on arm to assess for reaction. See instructions for home patch testing below. -Schedule patch testing for contact dermatitis.  Dermatological Concern Presence of small bumps on lips, not associated with itching. Suspect milia. -Referral to Dermatology (Dr. Nicholaus) for further evaluation and management.  General Health Maintenance -Continue use of antihistamines as needed, avoiding Benadryl due to side effects. -Discontinue antihistamines three days prior  to allergy  testing on 10/17/2023.     Follow up : next Monday at 9:30 AM, 1-55, shellfish, tree nuts, peanut  It was a pleasure meeting you in clinic today! Thank you for allowing me to participate in your care.  Rocky Endow, MD [facility]   This note in its entirety was forwarded to the Provider who requested this consultation.  Other: none  Thank you for your kind referral. I appreciate the opportunity to take part in Donald Fletcher's care. Please do not hesitate to contact me with questions.  Sincerely,  Rocky Endow, MD [facility] 

## 2023-10-10 ENCOUNTER — Ambulatory Visit (INDEPENDENT_AMBULATORY_CARE_PROVIDER_SITE_OTHER): Payer: 59 | Admitting: Internal Medicine

## 2023-10-10 ENCOUNTER — Encounter: Payer: Self-pay | Admitting: Internal Medicine

## 2023-10-10 ENCOUNTER — Other Ambulatory Visit: Payer: Self-pay

## 2023-10-10 VITALS — BP 122/80 | HR 72 | Temp 98.9°F | Ht 68.0 in | Wt 217.8 lb

## 2023-10-10 DIAGNOSIS — J310 Chronic rhinitis: Secondary | ICD-10-CM

## 2023-10-10 DIAGNOSIS — L234 Allergic contact dermatitis due to dyes: Secondary | ICD-10-CM

## 2023-10-10 DIAGNOSIS — T783XXA Angioneurotic edema, initial encounter: Secondary | ICD-10-CM | POA: Diagnosis not present

## 2023-10-10 DIAGNOSIS — L989 Disorder of the skin and subcutaneous tissue, unspecified: Secondary | ICD-10-CM

## 2023-10-10 NOTE — Patient Instructions (Signed)
 Seasonal Allergies Symptoms of itching, sneezing, and eye irritation during spring/summer months (March-June). Current management with Flonase has been effective with daily antihistamine (OTC). -Continue Flonase 2 sprays each nostril daily as needed during allergy  season. -continue antihistamine daily as needed: Your options include: Zyrtec (cetirizine) 10 mg, Claritin (loratadine) 10 mg, Xyzal (levocetirizine) 5 mg or Allegra (fexofenadine) 180 mg daily as needed -Schedule skin prick testing for pollen allergies on 10/17/2023. Must stop antihistamines for 3 days prior to this visit.  Food Allergies Recent reaction to almonds with lip swelling and itching, similar to previous reactions to shellfish. -Avoid all nuts and shellfish until further testing. -Schedule skin prick testing for nut and shellfish allergies on 10/17/2023. -continue to carry epipen in case of accidental exposure and reaction -consider medical alert jewelry -emergency action plan provided and reviewed.   Contact Dermatitis History of scalp irritation with hair dye use and recent lip irritation with unknown cause. 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 -Continue avoidance of hair dye and Carmax. -Use Vaseline or Aquaphor for lip care. -Perform home test with toothpaste on arm to assess for reaction. See instructions for home patch testing below. -Schedule patch testing for contact dermatitis.  Dermatological Concern Presence of small bumps on lips, not associated with itching. Suspect milia. -Referral to Dermatology (Dr. Nicholaus) for further evaluation and management.  General Health Maintenance -Continue use of antihistamines as needed, avoiding Benadryl due to side effects. -Discontinue antihistamines three days prior to allergy  testing on 10/17/2023.     Follow up : next Monday at 9:30 AM, 1-55, shellfish, tree nuts, peanut  It was a pleasure meeting you in clinic today! Thank you for allowing  me to participate in your care.  To test a skin care product, AAD recommend the following tips:  Apply the product to a test spot twice daily for seven to 10 days. Choose a quarter-sized spot on your skin where the product won't be rubbed or washed away, such as the underside of your arm or the bend of your elbow. Use the normal amount and thickness you would use as if you were applying the product regularly.  Leave the product on your skin for as long as you would normally. If you're testing something that you would usually wash off, like a cleanser, keep it on your skin for five minutes or as long as the instructions say.  If after seven to 10 days you don't have a skin reaction, such as red, itchy, or swollen skin, go ahead and use the product.  Keep in mind that some ingredients, such as retinol and glycolic acid, can irritate your skin, particularly if your skin is sensitive. This is normal and temporary.  If you develop a skin reaction, gently wash the product off as soon as possible, and don't use it again. Apply a cool compress or petroleum jelly to relieve your skin, if needed. If your reaction to a product is severe and not relieved with cool compresses or petroleum jelly, you may need to see your allergist to help manage your symptoms.

## 2023-10-17 ENCOUNTER — Ambulatory Visit (INDEPENDENT_AMBULATORY_CARE_PROVIDER_SITE_OTHER): Payer: 59 | Admitting: Internal Medicine

## 2023-10-17 ENCOUNTER — Encounter: Payer: Self-pay | Admitting: Internal Medicine

## 2023-10-17 DIAGNOSIS — T7800XD Anaphylactic reaction due to unspecified food, subsequent encounter: Secondary | ICD-10-CM

## 2023-10-17 DIAGNOSIS — T7800XA Anaphylactic reaction due to unspecified food, initial encounter: Secondary | ICD-10-CM | POA: Insufficient documentation

## 2023-10-17 DIAGNOSIS — T783XXD Angioneurotic edema, subsequent encounter: Secondary | ICD-10-CM

## 2023-10-17 DIAGNOSIS — J302 Other seasonal allergic rhinitis: Secondary | ICD-10-CM | POA: Insufficient documentation

## 2023-10-17 DIAGNOSIS — J3089 Other allergic rhinitis: Secondary | ICD-10-CM

## 2023-10-17 NOTE — Patient Instructions (Signed)
 Seasonal Allergies Symptoms of itching, sneezing, and eye irritation during spring/summer months (March-June). Current management with Flonase has been effective with daily antihistamine (OTC). -Continue Flonase 2 sprays each nostril daily as needed during allergy  season. -continue antihistamine daily as needed: Your options include: Zyrtec (cetirizine) 10 mg, Claritin (loratadine) 10 mg, Xyzal (levocetirizine) 5 mg or Allegra (fexofenadine) 180 mg daily as needed -allergy  testing today was positive to grass pollen, tree pollen, dust mites and cockroach, intradermals positive to additional grass pollen, ragweed and weed pollen mix  Food Allergies Recent reaction to almonds with lip swelling and itching, similar to previous reactions to shellfish. -Avoid all nuts and shellfish until further testing. -Shellfish mix positive on testing today, peanuts and tree nuts negative. Confirm with labs. -continue to carry epipen in case of accidental exposure and reaction -consider medical alert jewelry -emergency action plan provided and reviewed.   Contact Dermatitis History of scalp irritation with hair dye use and recent lip irritation with unknown cause. 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 -Continue avoidance of hair dye and Carmax. -Use Vaseline or Aquaphor for lip care. -Perform home test with toothpaste on arm to assess for reaction. See instructions for home patch testing below. -Schedule patch testing for contact dermatitis.  Dermatological Concern Presence of small bumps on lips, not associated with itching. Suspect milia. -Referral to Dermatology (Dr. Nicholaus) for further evaluation and management.  General Health Maintenance -Continue use of antihistamines as needed, avoiding Benadryl due to side effects. -Discontinue antihistamines three days prior to allergy  testing on 10/17/2023.     Follow up for patch testing at your convenience and 3 month follow-up with  me, sooner  if needed It was a pleasure seeing you again in clinic today! Thank you for allowing me to participate in your care.  Reducing Pollen Exposure  The American Academy of Allergy , Asthma and Immunology suggests the following steps to reduce your exposure to pollen during allergy  seasons.    Do not hang sheets or clothing out to dry; pollen may collect on these items. Do not mow lawns or spend time around freshly cut grass; mowing stirs up pollen. Keep windows closed at night.  Keep car windows closed while driving. Minimize morning activities outdoors, a time when pollen counts are usually at their highest. Stay indoors as much as possible when pollen counts or humidity is high and on windy days when pollen tends to remain in the air longer. Use air conditioning when possible.  Many air conditioners have filters that trap the pollen spores. Use a HEPA room air filter to remove pollen form the indoor air you breathe. DUST MITE AVOIDANCE MEASURES:  There are three main measures that need and can be taken to avoid house dust mites:  Reduce accumulation of dust in general -reduce furniture, clothing, carpeting, books, stuffed animals, especially in bedroom  Separate yourself from the dust -use pillow and mattress encasements (can be found at stores such as [employer] or online) -avoid direct exposure to air condition flow -use a HEPA filter device, especially in the bedroom; you can also use a HEPA filter vacuum cleaner -wipe dust with a moist towel instead of a dry towel or broom when cleaning  Decrease mites and/or their secretions -wash clothing and linen and stuffed animals at highest temperature possible, at least every 2 weeks -stuffed animals can also be placed in a bag and put in a freezer overnight  Despite the above measures, it is impossible to eliminate  dust mites or their allergen completely from your home.  With the above measures the burden of mites in  your home can be diminished, with the goal of minimizing your allergic symptoms.  Success will be reached only when implementing and using all means together. Control of Cockroach Allergen  Cockroach allergen has been identified as an important cause of acute attacks of asthma, especially in urban settings.  There are fifty-five species of cockroach that exist in the [place] , however only three, the American, German and Oriental species produce allergen that can affect patients with Asthma.  Allergens can be obtained from fecal particles, egg casings and secretions from cockroaches.    Remove food sources. Reduce access to water. Seal access and entry points. Spray runways with 0.5-1% Diazinon or Chlorpyrifos Blow boric acid power under stoves and refrigerator. Place bait stations (hydramethylnon) at feeding sites.

## 2023-10-17 NOTE — Progress Notes (Signed)
 Date of Service/Encounter:  10/17/23  Allergy  testing appointment   Initial visit on 10/10/23, seen for suspected seasonal allergies, concern for food allergies, contact dermatitis, dermatological concern of lip.  Please see that note for additional details.  Today reports for allergy  diagnostic testing:    DIAGNOSTICS:  Skin Testing: Environmental allergy  panel and select foods. Adequate positive and negative controls. Results discussed with patient/family.  Airborne Adult Perc - 10/17/23 0931     Time Antigen Placed 0931    Allergen Manufacturer Jestine    Location Back    Number of Test 55    Panel 1 Select    2. Control-Histamine 2+    3. Bahia Negative    4. Bermuda Negative    5. Johnson 2+    6. [place]  Blue Negative    7. Meadow Fescue 4+    8. Perennial Rye 4+    9. Timothy Negative    10. Ragweed Mix Negative    11. Cocklebur Negative    12. Plantain,  English Negative    13. Baccharis Negative    14. Dog Fennel Negative    15. Russian Thistle Negative    16. Lamb's Quarters Negative    17. Sheep Sorrell Negative    18. Rough Pigweed Negative    19. Marsh Elder, Rough Negative    20. Mugwort, Common Negative    21. Box, Elder Negative    22. Cedar, red Negative    23. Sweet Gum Negative    24. Pecan Pollen 4+    25. Pine Mix Negative    26. Walnut, Black Pollen 4+    27. Red Mulberry Negative    28. Ash Mix Negative    29. Birch Mix Negative    30. Beech American Negative    31. Cottonwood, Eastern Negative    32. Hickory, White 4+    33. Maple Mix Negative    34. Oak, Eastern Mix 4+    35. Sycamore Eastern Negative    36. Alternaria Alternata Negative    37. Cladosporium Herbarum Negative    38. Aspergillus Mix Negative    39. Penicillium Mix Negative    40. Bipolaris Sorokiniana (Helminthosporium) Negative    41. Drechslera Spicifera (Curvularia) Negative    42. Mucor Plumbeus Negative    43. Fusarium Moniliforme Negative    44.  Aureobasidium Pullulans (pullulara) Negative    45. Rhizopus Oryzae Negative    46. Botrytis Cinera Negative    47. Epicoccum Nigrum Negative    48. Phoma Betae Negative    49. Dust Mite Mix 3+    50. Cat Hair 10,000 BAU/ml Negative    51.  Dog Epithelia Negative    52. Mixed Feathers Negative    53. Horse Epithelia Negative    54. Cockroach, German 3+    55. Tobacco Leaf Negative             Intradermal - 10/17/23 1051     Time Antigen Placed 1015    Allergen Manufacturer Jestine    Location Arm    Number of Test 11    Intradermal Select    Control Negative    Bahia 4+    Bermuda 4+    Ragweed Mix 3+    Weed Mix 3+    Mold 1 Negative    Mold 2 Negative    Mold 3 Negative    Mold 4 Negative    Cat 2+    Dog Negative  Food Adult Perc - 10/17/23 0900     Time Antigen Placed 0930    Allergen Manufacturer Jestine    Location Back    Number of allergen test 12    1. Peanut  Negative    2. Soybean Omitted    3. Wheat Omitted    4. Sesame Omitted    5. Milk, Cow Omitted    6. Casein Omitted    7. Egg White, Chicken Omitted    8. Shellfish Mix --   5x7   9. Fish Mix Omitted    11. Walnut Food Negative    12. Almond Negative    13. Hazelnut Negative    14. Pecan Food Negative    15. Pistachio Negative    16. Brazil Nut Negative    17. Coconut Negative    18. Trout Omitted    19. Tuna Omitted    20. Salmon Omitted    21. Flounder Omitted    22. Codfish Omitted    23. Shrimp Omitted    24. Crab Omitted    25. Lobster Omitted    26. Oyster Omitted    27. Scallops Omitted    28. Oat  Omitted    29. Rice Omitted    30. Barley Omitted    31. Rye  Omitted    32. Hops Omitted    33. Turkey Meat Omitted    34. Chicken Meat Omitted    35. Pork Omitted    36. Beef Omitted    37. Lamb Omitted    38. Tomato Omitted    39. White Potato Omitted    40. Sweet Potato Omitted    41. Pea, Green/English Omitted    42. Navy Bean Omitted    43. Green  Beans Omitted    44. Squash Omitted    45. Green Pepper Omitted    46. Mushrooms Omitted    47. Onion Omitted    48. Avocado Omitted    49. Cabbage Omitted    50. Carrots Omitted    51. Celery Omitted    52. Corn Omitted    53. Cucumber Omitted    54. Grape (White seedless) Omitted    55. Orange  Omitted    56. Lemon Omitted    57. Banana Omitted    58. Apple Omitted    59. Peach Omitted    60. Strawberry Omitted    61. Blueberry Omitted    62. Cherry Omitted    63. Cantaloupe Omitted    64. Watermelon Omitted    65. Pineapple Omitted    66. Chocolate/Cacao Bean Omitted    67. Cinnamon Omitted    68. Nutmeg Omitted    69. Ginger Omitted    70. Garlic Omitted    71. Pepper, Black Omitted    72. Mustard Omitted             Allergy  testing results were read and interpreted by myself, documented by clinical staff.  Patient provided with copy of allergy  testing along with avoidance measures when indicated.   Rocky Endow, MD  [facility] 

## 2023-10-19 LAB — IGE NUT PROF. W/COMPONENT RFLX

## 2023-10-20 ENCOUNTER — Telehealth: Payer: Self-pay

## 2023-10-20 ENCOUNTER — Encounter: Payer: Self-pay | Admitting: Internal Medicine

## 2023-10-20 NOTE — Telephone Encounter (Signed)
Donald Fletcher,  Can you change the patients referral to [facility] & inform the patient?  Thanks  [facility] [address] [place], [address] [place], [place] [address] Phone: [phone] Fax: [phone]

## 2023-10-20 NOTE — Telephone Encounter (Signed)
-----   Message from Verlee Monte sent at 10/10/2023 10:17 AM EST ----- Can we refer to Derm please for lip dermatosis? Thanks!

## 2023-10-21 LAB — IGE NUT PROF. W/COMPONENT RFLX
F017-IgE Hazelnut (Filbert): 1.17 kU/L — AB
F018-IgE Brazil Nut: <0.1 kU/L
F020-IgE Almond: 0.16 kU/L — AB
F202-IgE Cashew Nut: <0.1 kU/L
F203-IgE Pistachio Nut: 0.31 kU/L — AB
F256-IgE Walnut: 0.82 kU/L — AB
Macadamia Nut, IgE: 0.54 kU/L — AB
Peanut, IgE: 1.05 kU/L — AB
Pecan Nut IgE: 0.15 kU/L — AB

## 2023-10-21 LAB — PANEL 604726
Cor A 1 IgE: 1.07 kU/L — AB
Cor A 14 IgE: <0.1 kU/L
Cor A 8 IgE: <0.1 kU/L
Cor A 9 IgE: <0.1 kU/L

## 2023-10-21 LAB — PEANUT COMPONENTS
F352-IgE Ara h 8: <0.1 kU/L
F422-IgE Ara h 1: <0.1 kU/L
F423-IgE Ara h 2: <0.1 kU/L
F424-IgE Ara h 3: <0.1 kU/L
F427-IgE Ara h 9: <0.1 kU/L
F447-IgE Ara h 6: <0.1 kU/L

## 2023-10-21 LAB — ALLERGEN PROFILE, SHELLFISH
Clam IgE: <0.1 kU/L
F023-IgE Crab: 0.34 kU/L — AB
F080-IgE Lobster: 0.3 kU/L — AB
F290-IgE Oyster: <0.1 kU/L
Scallop IgE: <0.1 kU/L
Shrimp IgE: 0.99 kU/L — AB

## 2023-10-21 LAB — PANEL 604721
Jug R 1 IgE: <0.1 kU/L
Jug R 3 IgE: 0.17 kU/L — AB

## 2023-10-21 LAB — ALLERGEN COMPONENT COMMENTS

## 2023-10-27 NOTE — Telephone Encounter (Signed)
Referral has been sent to   [facility] [address] [place], [place] [address]  [phone]   I called the patient and had gotten his voicemail so I left a message explaining we are referring him to [facility] and I left their phone number for him to call if he hadn't heard anything by next week.

## 2023-10-31 ENCOUNTER — Encounter: Payer: Self-pay | Admitting: Family Medicine

## 2023-10-31 ENCOUNTER — Ambulatory Visit (INDEPENDENT_AMBULATORY_CARE_PROVIDER_SITE_OTHER): Payer: 59 | Admitting: Family Medicine

## 2023-10-31 DIAGNOSIS — L235 Allergic contact dermatitis due to other chemical products: Secondary | ICD-10-CM

## 2023-10-31 NOTE — Progress Notes (Signed)
Follow-up Note  RE: Arad Burston MRN: [id number] DOB: [birth year] Date of Office Visit: 10/31/2023  Primary care provider: Cyril Mourning, FNP Referring provider: Cyril Mourning, FNP   Cedrick returns to the office today for the patch test placement, given suspected history of contact dermatitis.  He was last seen in this clinic on 10/17/2023 by Dr. Maurine Minister for environmental allergy skin testing and food allergy skin testing.  Prior to that he was seen in this clinic as a new patient by Dr. Maurine Minister on 10/10/2023 for evaluation of allergic rhinitis, allergic contact dermatitis, and food allergy. His last environmental allergy skin testing was on 10/17/2023 and was positive to grass pollen, tree pollen, ragweed pollen, weed pollen, dust mite, and cockroach. His last food allergy skin testing was on 10/17/2023 and was positive to shellfish.  Lab work from 10/17/2023 was positive to peanuts, tree nuts, and shellfish  Diagnostics: True Test patches placed.   Plan:   Allergic contact dermatitis - Instructions provided on care of the patches for the next 48 hours. Ridgely Anastacio was instructed to avoid showering for the next 48 hours. Daman Steffenhagen will follow up in 48 hours and 96 hours for patch readings.    Call the clinic if this treatment plan is not working well for you  Follow up in 2 days or sooner if needed.  Thank you for the opportunity to care for this patient.  Please do not hesitate to contact me with questions.  Thermon Leyland, FNP [facility]

## 2023-10-31 NOTE — Patient Instructions (Addendum)
Diagnostics: True Test patches placed.   Plan:   Allergic contact dermatitis - Instructions provided on care of the patches for the next 48 hours. Donald Fletcher was instructed to avoid showering for the next 48 hours. [name] will follow up in 48 hours and 96 hours for patch readings.    Call the clinic if this treatment plan is not working well for you  Follow up in 2 days or sooner if needed.

## 2023-11-02 ENCOUNTER — Encounter: Payer: Self-pay | Admitting: Allergy

## 2023-11-02 ENCOUNTER — Ambulatory Visit (INDEPENDENT_AMBULATORY_CARE_PROVIDER_SITE_OTHER): Payer: 59 | Admitting: Allergy

## 2023-11-02 DIAGNOSIS — L235 Allergic contact dermatitis due to other chemical products: Secondary | ICD-10-CM | POA: Diagnosis not present

## 2023-11-02 NOTE — Progress Notes (Signed)
    Follow-up Note  RE: Donald Fletcher MRN: [id number] DOB: 28-Dec-1970 Date of Office Visit: 11/02/2023  Primary care provider: Cyril Mourning, FNP Referring provider: Cyril Mourning, FNP   Othal returns to the office today for the initial patch test interpretation, given suspected history of contact dermatitis.    Diagnostics:  TRUE TEST 48 hour reading:   T.R.U.E. Test     Row Name 11/02/23 0900           Time Antigen Placed 8295       Manufacturer ALK Abello       Location Back       Number of Test 36       Reading Interval Day 3       Panel Panel 1;Panel 2;Panel 3       1. Nickel Sulfate 0       2. Wool Alcohols 0       3. Neomycin Sulfate 0       4. Potassium Dichromate 0       5. Caine Mix 0       6. Fragrance Mix 0       7. Colophony 0       8. Paraben Mix 1       9. Negative Control 0       10. Balsam of [place] 0       11. Ethylenediamine Dihydrochloride 0       12. Cobalt Dichloride 0       13. p-tert Butylphenol Formaldehyde Resin 0       14. Epoxy Resin 0       15. Carba Mix 0       16.  Black Rubber Mix 0       17. Cl+ Me-Isothiazolinone 2       18. Quaternium-15 0       19. Methyldibromo Glutaronitrile 0       20. p-Phenylenediamine 2       21. Formaldehyde 0       22. Mercapto Mix 0       23. Thimerosal 0       24. Thiuram Mix 0       25. Diazolidinyl Urea 0       26. Quinoline Mix 0       27. Tixocortol-21-Pivalate 0       28. Gold Sodium Thiosulfate 0       29. Imidazolidinyl Urea 0       30. Budesonide 0       31. Hydrocortisone-17-Butyrate 0       32. Mercaptobenzothiazole 0       33. Bacitracin 0       34. Parthenolide 0       35. Disperse Blue 106 0       36. 2-Bromo-2-Nitropropane-1,3-diol 0                   Plan:  Allergic contact dermatitis The patient has been provided detailed information regarding the substances she is sensitive to, as well as products containing the substances.  Meticulous avoidance of these  substances is recommended. If avoidance is not possible, the use of barrier creams or lotions is recommended. If symptoms persist or progress despite meticulous avoidance of above allergens, dermatology evaluation may be warranted.   He will return in 2 days for final reading  Margo Aye, MD [facility]

## 2023-11-04 ENCOUNTER — Ambulatory Visit (INDEPENDENT_AMBULATORY_CARE_PROVIDER_SITE_OTHER): Payer: 59 | Admitting: Allergy

## 2023-11-04 ENCOUNTER — Encounter: Payer: Self-pay | Admitting: Allergy

## 2023-11-04 DIAGNOSIS — L2389 Allergic contact dermatitis due to other agents: Secondary | ICD-10-CM | POA: Diagnosis not present

## 2023-11-04 NOTE — Progress Notes (Signed)
   Follow Up Note  RE: Donald Fletcher MRN: [id number] DOB: 29-Aug-1971 Date of Office Visit: 11/04/2023  Referring provider: Cyril Mourning, FNP Primary care provider: Cyril Mourning, FNP  History of Present Illness: I had the pleasure of seeing Donald Fletcher for a [facility]  on 11/04/2023. He is a 53 y.o. male, who is being followed for dermatitis. Today he is here for final patch test interpretation, given suspected history of contact dermatitis.   Diagnostics:  TRUE TEST 96 hour reading:   T.R.U.E. Test - 11/04/23 1000       Test Information   Time Antigen Placed 1016    Manufacturer Other    Location Back    Number of Test 36    Reading Interval Day 3    Panel Panel 1;Panel 2;Panel 3      Panel 1   1. Nickel Sulfate 0    2. Wool Alcohols 0    3. Neomycin Sulfate 1    4. Potassium Dichromate 0    5. Caine Mix --   +/-   6. Fragrance Mix 0    7. Colophony 0    8. Paraben Mix 1    9. Negative Control 0    10. Balsam of [place] 0    11. Ethylenediamine Dihydrochloride 0    12. Cobalt Dichloride 0      Panel 2   13. p-tert Butylphenol Formaldehyde Resin 0    14. Epoxy Resin 0    15. Carba Mix 0    16.  Black Rubber Mix 0    17. Cl+ Me-Isothiazolinone 3    18. Quaternium-15 0    19. Methyldibromo Glutaronitrile 0    20. p-Phenylenediamine 2    21. Formaldehyde 0    22. Mercapto Mix 0    23. Thimerosal 0    24. Thiuram Mix 0      Panel 3   25. Diazolidinyl Urea 0    26. Quinoline Mix 0    27. Tixocortol-21-Pivalate 0    28. Gold Sodium Thiosulfate 0    29. Imidazolidinyl Urea 0    30. Budesonide 0    31. Hydrocortisone-17-Butyrate 0    32. Mercaptobenzothiazole 0    33. Bacitracin 1    34. Parthenolide 0    35. Disperse Blue 106 0    36. 2-Bromo-2-Nitropropane-1,3-diol 0              Assessment and Plan: Donald Fletcher is a 53 y.o. male with: Allergic contact dermatitis due to other agents Final true patch  reading was positive to: Neomycin Sulfate Paraben Mix Cl+ Me-Isothiazolinone p-Phenylenediamine Bacitracin  Borderline to: [employer] given to patient. Safelist emailed to patient.   Return in about 2 months (around 01/02/2024).  It was my pleasure to see Donald Fletcher today and participate in his care. Please feel free to contact me with any questions or concerns.  Sincerely,  Wyline Mood, DO Allergy & Immunology  [facility] office: [phone] [facility] office: [phone] [place] office: [phone]

## 2023-11-04 NOTE — Patient Instructions (Addendum)
Final patch testing done today Will email you safe list. Avoid the items that were positive on your patch testing.   Keep follow up with Dr. Maurine Minister in April.

## 2024-01-23 ENCOUNTER — Ambulatory Visit (INDEPENDENT_AMBULATORY_CARE_PROVIDER_SITE_OTHER): Payer: 59 | Admitting: Internal Medicine

## 2024-01-23 ENCOUNTER — Encounter: Payer: Self-pay | Admitting: Internal Medicine

## 2024-01-23 ENCOUNTER — Other Ambulatory Visit: Payer: Self-pay

## 2024-01-23 VITALS — BP 134/82 | HR 70 | Temp 98.9°F | Wt 210.3 lb

## 2024-01-23 DIAGNOSIS — L235 Allergic contact dermatitis due to other chemical products: Secondary | ICD-10-CM

## 2024-01-23 DIAGNOSIS — T7800XA Anaphylactic reaction due to unspecified food, initial encounter: Secondary | ICD-10-CM

## 2024-01-23 DIAGNOSIS — T7800XD Anaphylactic reaction due to unspecified food, subsequent encounter: Secondary | ICD-10-CM | POA: Diagnosis not present

## 2024-01-23 DIAGNOSIS — J302 Other seasonal allergic rhinitis: Secondary | ICD-10-CM

## 2024-01-23 DIAGNOSIS — L259 Unspecified contact dermatitis, unspecified cause: Secondary | ICD-10-CM | POA: Insufficient documentation

## 2024-01-23 DIAGNOSIS — Q386 Other congenital malformations of mouth: Secondary | ICD-10-CM | POA: Diagnosis not present

## 2024-01-23 DIAGNOSIS — J3089 Other allergic rhinitis: Secondary | ICD-10-CM

## 2024-01-23 MED ORDER — VTAMA 1 % EX CREA: 1.0000 | TOPICAL_CREAM | Freq: Every day | CUTANEOUS | Status: AC | PRN

## 2024-01-23 NOTE — Patient Instructions (Addendum)
 Seasonal Allergies Symptoms of itching, sneezing, and eye irritation during spring/summer months (March-June). Current management with Flonase has been effective with daily antihistamine (OTC). -Continue Flonase 2 sprays each nostril daily as needed during allergy  season. -continue antihistamine daily as needed: Your options include: Zyrtec (cetirizine) 10 mg, Claritin (loratadine) 10 mg, Xyzal (levocetirizine) 5 mg or Allegra (fexofenadine) 180 mg daily as needed -allergy  testing 10/17/23 was positive to grass pollen, tree pollen, dust mites and cockroach, intradermals positive to additional grass pollen, ragweed and weed pollen mix  Food Allergies Recent reaction to almonds with lip swelling and itching, similar to previous reactions to shellfish. -Labs and skin testing positive to shellfish, labs positive to treenuts and peanuts. Allergen avoidance.  -continue to carry epipen in case of accidental exposure and reaction -consider medical alert jewelry -emergency action plan provided and reviewed.   Contact Dermatitis with fordyce spots on lips (benign, but bothersome) History of scalp irritation with hair dye use and recent lip irritation with unknown cause. 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 -Continue avoidance of hair dye and Carmax. -Use Vaseline or Aquaphor for lip care. -Patch testing positive for Caine mix borderline, 1+ paraben mix, 1+ neomycin,  3+ Cl+Me-Isothiazolinone, Recommend avoidance. -if reaction, can use hydrocortisone 2.5% twice daily as needed for rash on face -triamcinolone  0.1% twice daily as needed for rash on body (do NOT use on face, groin or armpits).  - try VTAMA  (non-steroid ointment) for rash on lips. Daily as needed.     Follow up 12 months, sooner if needed.  It was a pleasure seeing you again in clinic today! Thank you for allowing me to participate in your care.  Reducing Pollen Exposure  The American Academy of Allergy ,  Asthma and Immunology suggests the following steps to reduce your exposure to pollen during allergy  seasons.    Do not hang sheets or clothing out to dry; pollen may collect on these items. Do not mow lawns or spend time around freshly cut grass; mowing stirs up pollen. Keep windows closed at night.  Keep car windows closed while driving. Minimize morning activities outdoors, a time when pollen counts are usually at their highest. Stay indoors as much as possible when pollen counts or humidity is high and on windy days when pollen tends to remain in the air longer. Use air conditioning when possible.  Many air conditioners have filters that trap the pollen spores. Use a HEPA room air filter to remove pollen form the indoor air you breathe. DUST MITE AVOIDANCE MEASURES:  There are three main measures that need and can be taken to avoid house dust mites:  Reduce accumulation of dust in general -reduce furniture, clothing, carpeting, books, stuffed animals, especially in bedroom  Separate yourself from the dust -use pillow and mattress encasements (can be found at stores such as [employer] or online) -avoid direct exposure to air condition flow -use a HEPA filter device, especially in the bedroom; you can also use a HEPA filter vacuum cleaner -wipe dust with a moist towel instead of a dry towel or broom when cleaning  Decrease mites and/or their secretions -wash clothing and linen and stuffed animals at highest temperature possible, at least every 2 weeks -stuffed animals can also be placed in a bag and put in a freezer overnight  Despite the above measures, it is impossible to eliminate dust mites or their allergen completely from your home.  With the above measures the burden of mites in your home  can be diminished, with the goal of minimizing your allergic symptoms.  Success will be reached only when implementing and using all means together. Control of Cockroach  Allergen  Cockroach allergen has been identified as an important cause of acute attacks of asthma, especially in urban settings.  There are fifty-five species of cockroach that exist in the [place] , however only three, the [place], [place] and [place] species produce allergen that can affect patients with Asthma.  Allergens can be obtained from fecal particles, egg casings and secretions from cockroaches.    Remove food sources. Reduce access to water. Seal access and entry points. Spray runways with 0.5-1% Diazinon or Chlorpyrifos Blow boric acid power under stoves and refrigerator. Place bait stations (hydramethylnon) at feeding sites.

## 2024-01-23 NOTE — Progress Notes (Signed)
 Medication Samples have been provided to the patient.  Drug name: Vtama        Strength: 1%        Qty: 2  LOT: [id number]  Exp.Date: 04/02/2024  Dosing instructions: Twice daily as needed  The patient has been instructed regarding the correct time, dose, and frequency of taking this medication, including desired effects and most common side effects.   Donald Fletcher 4:46 PM 01/23/2024

## 2024-01-23 NOTE — Progress Notes (Signed)
 FOLLOW UP Date of Service/Encounter:  01/23/24  Subjective:  Donald Fletcher (DOB: 05-01-1971) is a 53 y.o. male who [facility] in re-evaluation of the following: Allergic rhinitis, shellfish, peanut  and tree nut food allergies, contact dermatitis History obtained from: chart review and patient.  For Review, LV was on 11/04/23  with Dr. Burdette Carolin seen for  final patch test read . See below for summary of history and diagnostics.  ----------------------------------------------------- Pertinent History/Diagnostics:  Seasonal and Perennial Allergies Symptoms of itching, sneezing, and eye irritation during spring/summer months (March-June). Current management with Flonase has been effective with daily antihistamine (OTC). -SPT 10/17/23: positive to grass pollen, tree pollen, dust mites and cockroach, intradermals positive to additional grass pollen, ragweed and weed pollen mix  Food Allergies Recent reaction to almonds with lip swelling and itching, similar to previous reactions to shellfish. Shellfish mix positive on SPT 10/17/23, peanuts and tree nuts negative  -Labs 10/17/23: positive to tree nuts, peanuts and shellfish (shrimp, crab and lobster), mollusks negative Contact dermatitis  History of scalp irritation with hair dye use and recent lip irritation with unknown cause. 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 -Continue avoidance of hair dye and Carmax. -patch testing January 2025: Caine mix borderline, 1+ paraben mix, 1+ neomycin,  3+ Cl+Me-Isothiazolinone, 2+ PPD, 1+ bacitracin --------------------------------------------------- Today presents for follow-up. Discussed the use of AI scribe software for clinical note transcription with the patient, who gave verbal consent to proceed.  History of Present Illness   Donald Fletcher is a 53 year old male who presents for follow-up on allergy  testing results and management of  contact dermatitis.  Patch testing revealed multiple positive results, including reactions to PPD found in hair dye and topical antibiotics like Neosporin. He used Neosporin for a skin infection a couple of months ago, which resulted in further irritation.  He has environmental allergies and manages them with Flonase, which he finds effective. He continues to wear a mask as a precautionary measure.  Regarding food allergies, skin tests for tree nuts and peanuts were negative, but blood work showed positive results He has been avoiding all nuts including peanuts and shellfish and has an epipen which he has not needed to use.  He experienced a recent skin breakout after a sunburn during a cruise to the [place], which resulted in peeling and hives. He used a steroid cream, which resolved the issue within a day.  He has persistent bumps on his lips, which were evaluated by a dermatologist who suggested they might resolve on their own. The bumps do bother him and he is curious if there is something else he might try. He is currently using cerave but did stop using carmax as suggested at initial encounter.    All medications reviewed by clinical staff and updated in chart. No new pertinent medical or surgical history except as noted in HPI.  ROS: All others negative except as noted per HPI.   Objective:  BP 134/82 (BP Location: Left Arm, Patient Position: Sitting, Cuff Size: Normal)   Pulse 70   Temp 98.9 F (37.2 C) (Temporal)   Wt 210 lb 4.8 oz (95.4 kg)   SpO2 98%   BMI 31.98 kg/m  Body mass index is 31.98 kg/m. Physical Exam: General Appearance:  Alert, cooperative, no distress, appears stated age  Head:  Normocephalic, without obvious abnormality, atraumatic  Eyes:  Conjunctiva clear, EOM's intact  Ears EACs normal bilaterally and normal TMs bilaterally  Nose: Nares normal,  hypertrophic turbinates, normal mucosa, and no visible anterior polyps  Throat: Lips with small flesh colored  bumps near vermillion border in top lip, tongue normal; teeth and gums normal, normal posterior oropharynx  Neck: Supple, symmetrical  Lungs:   clear to auscultation bilaterally, Respirations unlabored, no coughing  Heart:  regular rate and rhythm and no murmur, Appears well perfused  Extremities: No edema  Skin: Skin color, texture, turgor normal and no rashes or lesions on visualized portions of skin  Neurologic: No gross deficits   Labs:  Lab Orders  No laboratory test(s) ordered today    Assessment/Plan   Seasonal Allergies-at goal Symptoms of itching, sneezing, and eye irritation during spring/summer months (March-June). Current management with Flonase has been effective with daily antihistamine (OTC). -Continue Flonase 2 sprays each nostril daily as needed during allergy  season. -continue antihistamine daily as needed: Your options include: Zyrtec (cetirizine) 10 mg, Claritin (loratadine) 10 mg, Xyzal (levocetirizine) 5 mg or Allegra (fexofenadine) 180 mg daily as needed -allergy  testing 10/17/23 was positive to grass pollen, tree pollen, dust mites and cockroach, intradermals positive to additional grass pollen, ragweed and weed pollen mix  Food Allergies-stable Recent reaction to almonds with lip swelling and itching, similar to previous reactions to shellfish. -Labs and skin testing positive to shellfish, labs positive to treenuts and peanuts. Allergen avoidance.  -continue to carry epipen in case of accidental exposure and reaction -consider medical alert jewelry -emergency action plan provided and reviewed.   Contact Dermatitis with fordyce spots on lips (benign, but bothersome)-at goal History of scalp irritation with hair dye use and recent lip irritation with unknown cause. 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 -Continue avoidance of hair dye and Carmax. -Use Vaseline or Aquaphor for lip care. -Patch testing positive for Caine mix borderline,  1+ paraben mix, 1+ neomycin,  3+ Cl+Me-Isothiazolinone, Recommend avoidance. -if reaction, can use hydrocortisone 2.5% twice daily as needed for rash on face -triamcinolone  0.1% twice daily as needed for rash on body (do NOT use on face, groin or armpits).  - try VTAMA  (non-steroid ointment) for rash on lips. Twice daily as needed.     Follow up 12 months, sooner if needed.  It was a pleasure seeing you again in clinic today! Thank you for allowing me to participate in your care.  Other: samples provided of: VTAMA   Jonathon Neighbors, MD  [facility] [place] 

## 2025-01-28 ENCOUNTER — Ambulatory Visit: Admitting: Internal Medicine
